# Patient Record
Sex: Male | Born: 1995 | Race: White | Hispanic: No | Marital: Single
Health system: Southern US, Community
[De-identification: ages and names within clinical notes are randomized; demographics above are authoritative.]

---
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
## 2022-05-31 ENCOUNTER — Ambulatory Visit: Payer: BC Managed Care – PPO | Admitting: Podiatry

## 2022-05-31 ENCOUNTER — Encounter: Payer: Self-pay | Admitting: Podiatry

## 2022-05-31 DIAGNOSIS — L603 Nail dystrophy: Secondary | ICD-10-CM | POA: Diagnosis not present

## 2022-05-31 DIAGNOSIS — S91209A Unspecified open wound of unspecified toe(s) with damage to nail, initial encounter: Secondary | ICD-10-CM | POA: Diagnosis not present

## 2022-05-31 NOTE — Progress Notes (Signed)
  Subjective:  Patient ID: George Lynch, male    DOB: [birth year],   MRN: [id number]  Chief Complaint  Patient presents with   Nail Problem     Left great toe nail came off , no pain     26 y.o. male presents for concern of left great toenail coming off. Relates months ago he stubbed his toe and nail was injured. This past Saturday the nail fell off. He was concerned for open wound and possible infection and wanted evaluated. Denies any pain. Has been keeping neosporin on the toe.  . Denies any other pedal complaints. Denies n/v/f/c.   No past medical history on file.  Objective:  Physical Exam: Vascular: DP/PT pulses 2/4 bilateral. CFT <3 seconds. Normal hair growth on digits. No edema.  Skin. No lacerations or abrasions bilateral feet. Left hallux nail autoavulsed with healthy nail bed and no  open wounds.  Musculoskeletal: MMT 5/5 bilateral lower extremities in DF, PF, Inversion and Eversion. Deceased ROM in DF of ankle joint.  Neurological: Sensation intact to light touch.   Assessment:   1. Onychodystrophy   2. Avulsion of toenail of left foot      Plan:  Patient was evaluated and treated and all questions answered. -Examined patient -Discussed treatment options for painful dystrophic nails  Toe was evaluated and appears to be healing well.  May discontinue soaks and neosporin.  Patient to follow-up as needed.    Louann Sjogren, DPM
